# Patient Record
Sex: Male | Born: 1990 | Race: White | Hispanic: No | Marital: Single | Smoking: Current every day smoker
Health system: Southern US, Community
[De-identification: ages and names within clinical notes are randomized; demographics above are authoritative.]

---
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
## 2013-03-23 ENCOUNTER — Emergency Department (HOSPITAL_COMMUNITY): Payer: BC Managed Care – PPO

## 2013-03-23 ENCOUNTER — Encounter (HOSPITAL_COMMUNITY): Payer: Self-pay | Admitting: Emergency Medicine

## 2013-03-23 ENCOUNTER — Emergency Department (HOSPITAL_COMMUNITY)
Admission: EM | Admit: 2013-03-23 | Discharge: 2013-03-23 | Disposition: A | Discharge status: Home / Self Care | Payer: BC Managed Care – PPO | Attending: Emergency Medicine | Admitting: Emergency Medicine

## 2013-03-23 DIAGNOSIS — S0990XA Unspecified injury of head, initial encounter: Secondary | ICD-10-CM | POA: Insufficient documentation

## 2013-03-23 DIAGNOSIS — S01511A Laceration without foreign body of lip, initial encounter: Secondary | ICD-10-CM

## 2013-03-23 DIAGNOSIS — S01309A Unspecified open wound of unspecified ear, initial encounter: Secondary | ICD-10-CM | POA: Insufficient documentation

## 2013-03-23 DIAGNOSIS — S01311A Laceration without foreign body of right ear, initial encounter: Secondary | ICD-10-CM

## 2013-03-23 DIAGNOSIS — S01501A Unspecified open wound of lip, initial encounter: Secondary | ICD-10-CM | POA: Insufficient documentation

## 2013-03-23 DIAGNOSIS — T07XXXA Unspecified multiple injuries, initial encounter: Secondary | ICD-10-CM

## 2013-03-23 DIAGNOSIS — Y92410 Unspecified street and highway as the place of occurrence of the external cause: Secondary | ICD-10-CM | POA: Insufficient documentation

## 2013-03-23 DIAGNOSIS — S301XXA Contusion of abdominal wall, initial encounter: Secondary | ICD-10-CM

## 2013-03-23 DIAGNOSIS — S20229A Contusion of unspecified back wall of thorax, initial encounter: Secondary | ICD-10-CM | POA: Insufficient documentation

## 2013-03-23 DIAGNOSIS — Y9389 Activity, other specified: Secondary | ICD-10-CM | POA: Insufficient documentation

## 2013-03-23 DIAGNOSIS — S79919A Unspecified injury of unspecified hip, initial encounter: Secondary | ICD-10-CM | POA: Insufficient documentation

## 2013-03-23 DIAGNOSIS — S79929A Unspecified injury of unspecified thigh, initial encounter: Secondary | ICD-10-CM | POA: Insufficient documentation

## 2013-03-23 DIAGNOSIS — IMO0002 Reserved for concepts with insufficient information to code with codable children: Secondary | ICD-10-CM | POA: Insufficient documentation

## 2013-03-23 DIAGNOSIS — R079 Chest pain, unspecified: Secondary | ICD-10-CM | POA: Insufficient documentation

## 2013-03-23 DIAGNOSIS — S0590XA Unspecified injury of unspecified eye and orbit, initial encounter: Secondary | ICD-10-CM | POA: Insufficient documentation

## 2013-03-23 MED ORDER — HYDROCODONE-ACETAMINOPHEN 5-325 MG PO TABS: ORAL_TABLET | ORAL | Status: DC

## 2013-03-23 MED ORDER — CYCLOBENZAPRINE HCL 10 MG PO TABS: 10.0000 mg | ORAL_TABLET | Freq: Three times a day (TID) | ORAL | Status: AC | PRN

## 2013-03-23 MED ORDER — NAPROXEN 500 MG PO TABS: 500.0000 mg | ORAL_TABLET | Freq: Two times a day (BID) | ORAL | Status: AC

## 2013-03-23 MED ORDER — MORPHINE SULFATE 4 MG/ML IJ SOLN
4.0000 mg | Freq: Once | INTRAMUSCULAR | Status: AC
  Administered 2013-03-23: 4 mg via INTRAVENOUS
  Filled 2013-03-23: qty 1

## 2013-03-23 MED ORDER — IOHEXOL 300 MG/ML  SOLN
100.0000 mL | Freq: Once | INTRAMUSCULAR | Status: AC | PRN
  Administered 2013-03-23: 100 mL via INTRAVENOUS

## 2013-03-23 MED ORDER — ONDANSETRON HCL 4 MG PO TABS: 4.0000 mg | ORAL_TABLET | Freq: Four times a day (QID) | ORAL | Status: AC

## 2013-03-23 MED ORDER — ONDANSETRON HCL 4 MG/2ML IJ SOLN
4.0000 mg | Freq: Once | INTRAMUSCULAR | Status: AC
  Administered 2013-03-23: 4 mg via INTRAVENOUS
  Filled 2013-03-23: qty 2

## 2013-03-23 MED ORDER — LIDOCAINE HCL (PF) 1 % IJ SOLN
INTRAMUSCULAR | Status: AC
  Administered 2013-03-23: 5 mL
  Filled 2013-03-23: qty 5
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
## 2013-03-23 NOTE — ED Notes (Signed)
Pt c/o face and right leg pain.

## 2013-03-23 NOTE — ED Notes (Signed)
Assisted Dr. Hyacinth Meeker; placed 7 sutures in bottom lip. Applied dermabond to left ear.
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
## 2013-03-23 NOTE — ED Notes (Signed)
State [employer]oper in room talking to patient at this time.
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
## 2013-03-29 ENCOUNTER — Emergency Department (HOSPITAL_COMMUNITY)
Admission: EM | Admit: 2013-03-29 | Discharge: 2013-03-29 | Disposition: A | Discharge status: Home / Self Care | Payer: BC Managed Care – PPO | Attending: Emergency Medicine | Admitting: Emergency Medicine

## 2013-03-29 ENCOUNTER — Encounter (HOSPITAL_COMMUNITY): Payer: Self-pay

## 2013-03-29 DIAGNOSIS — Z791 Long term (current) use of non-steroidal anti-inflammatories (NSAID): Secondary | ICD-10-CM | POA: Insufficient documentation

## 2013-03-29 DIAGNOSIS — Z79899 Other long term (current) drug therapy: Secondary | ICD-10-CM | POA: Insufficient documentation

## 2013-03-29 DIAGNOSIS — Z5189 Encounter for other specified aftercare: Secondary | ICD-10-CM

## 2013-03-29 DIAGNOSIS — F17200 Nicotine dependence, unspecified, uncomplicated: Secondary | ICD-10-CM | POA: Insufficient documentation

## 2013-03-29 DIAGNOSIS — K1370 Unspecified lesions of oral mucosa: Secondary | ICD-10-CM

## 2013-03-29 MED ORDER — CLINDAMYCIN HCL 150 MG PO CAPS: 300.0000 mg | ORAL_CAPSULE | Freq: Three times a day (TID) | ORAL | Status: AC

## 2013-03-29 MED ORDER — MAGIC MOUTHWASH W/LIDOCAINE: ORAL | Status: AC
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
## 2013-04-02 NOTE — ED Provider Notes (Signed)
Medical screening examination/treatment/procedure(s) were performed by non-physician practitioner and as supervising physician I was immediately available for consultation/collaboration.   Shelda Jakes, MD 04/02/13 [phone]

## 2015-12-02 ENCOUNTER — Encounter: Payer: Self-pay | Admitting: Emergency Medicine

## 2015-12-02 ENCOUNTER — Emergency Department: Payer: BLUE CROSS/BLUE SHIELD

## 2015-12-02 ENCOUNTER — Emergency Department
Admission: EM | Admit: 2015-12-02 | Discharge: 2015-12-02 | Disposition: A | Discharge status: Home / Self Care | Payer: BLUE CROSS/BLUE SHIELD | Attending: Emergency Medicine | Admitting: Emergency Medicine

## 2015-12-02 DIAGNOSIS — Y92410 Unspecified street and highway as the place of occurrence of the external cause: Secondary | ICD-10-CM | POA: Insufficient documentation

## 2015-12-02 DIAGNOSIS — K0889 Other specified disorders of teeth and supporting structures: Secondary | ICD-10-CM | POA: Diagnosis present

## 2015-12-02 DIAGNOSIS — M79606 Pain in leg, unspecified: Secondary | ICD-10-CM

## 2015-12-02 DIAGNOSIS — F10220 Alcohol dependence with intoxication, uncomplicated: Secondary | ICD-10-CM | POA: Diagnosis not present

## 2015-12-02 DIAGNOSIS — F17210 Nicotine dependence, cigarettes, uncomplicated: Secondary | ICD-10-CM | POA: Diagnosis not present

## 2015-12-02 DIAGNOSIS — F10920 Alcohol use, unspecified with intoxication, uncomplicated: Secondary | ICD-10-CM

## 2015-12-02 DIAGNOSIS — Z79899 Other long term (current) drug therapy: Secondary | ICD-10-CM | POA: Insufficient documentation

## 2015-12-02 DIAGNOSIS — Y9389 Activity, other specified: Secondary | ICD-10-CM | POA: Diagnosis not present

## 2015-12-02 DIAGNOSIS — S0181XA Laceration without foreign body of other part of head, initial encounter: Secondary | ICD-10-CM | POA: Insufficient documentation

## 2015-12-02 DIAGNOSIS — Y999 Unspecified external cause status: Secondary | ICD-10-CM | POA: Insufficient documentation

## 2015-12-02 MED ORDER — ETODOLAC 200 MG PO CAPS: 200.0000 mg | ORAL_CAPSULE | Freq: Three times a day (TID) | ORAL | Status: AC

## 2015-12-02 MED ORDER — IBUPROFEN 800 MG PO TABS
800.0000 mg | ORAL_TABLET | Freq: Once | ORAL | Status: AC
  Administered 2015-12-02: 800 mg via ORAL
  Filled 2015-12-02: qty 1

## 2015-12-02 MED ORDER — LIDOCAINE HCL (PF) 1 % IJ SOLN
INTRAMUSCULAR | Status: AC
  Administered 2015-12-02: 5 mL
  Filled 2015-12-02: qty 5
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
## 2015-12-02 NOTE — ED Notes (Signed)
Mallie Mussel RN collected a blood sample form the Pt with the kit provided by officer Tyrone Nine from [employer]. The kit was opened by Mallie Mussel RN in the presence of officer Tyrone Nine, Hotel manager, officer Delena Bali and the Pt; the integrity seal of the kit was intact at the time the kit was opened. Blood was drawn according to policy and all the materials used were part of the kit provided by [employer].

## 2015-12-02 NOTE — ED Notes (Signed)
Garen Woolbright RN cleaned the Pt's lip laceration.

## 2015-12-02 NOTE — ED Notes (Signed)
[facility]
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
